# Patient Record
Sex: Female | Born: 1935 | Race: White | Hispanic: No | Smoking: Former smoker
Health system: Southern US, Community
[De-identification: ages and names within clinical notes are randomized; demographics above are authoritative.]

## PROBLEM LIST (undated history)

## (undated) DIAGNOSIS — K759 Inflammatory liver disease, unspecified: Secondary | ICD-10-CM

## (undated) DIAGNOSIS — M1990 Unspecified osteoarthritis, unspecified site: Secondary | ICD-10-CM

---

## 2005-03-07 ENCOUNTER — Ambulatory Visit: Payer: Self-pay | Admitting: Pain Medicine

## 2005-03-20 ENCOUNTER — Ambulatory Visit: Payer: Self-pay | Admitting: Pain Medicine

## 2005-07-19 ENCOUNTER — Ambulatory Visit: Payer: Self-pay | Admitting: Internal Medicine

## 2008-01-16 IMAGING — US ABDOMEN ULTRASOUND
1 series · 17 of 25 positions shown · non-contrast
Comparison: none

REASON FOR EXAM: Abdominal pain and nausea
COMMENTS:

[Series 1: abdomen ultrasound · 17 of 55 slices shown]
[im 1/55]
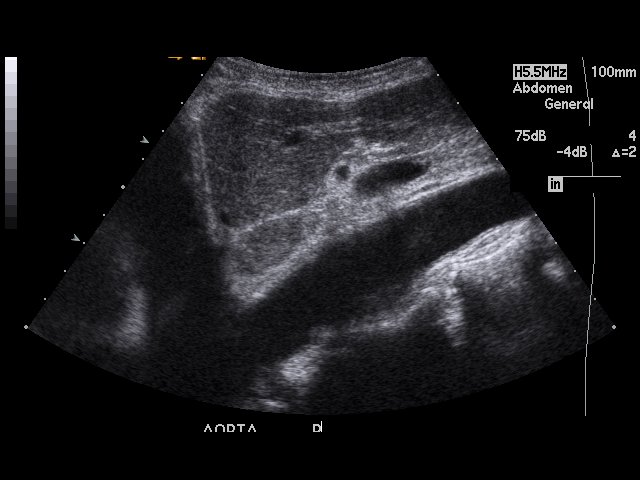
[im 5/55]
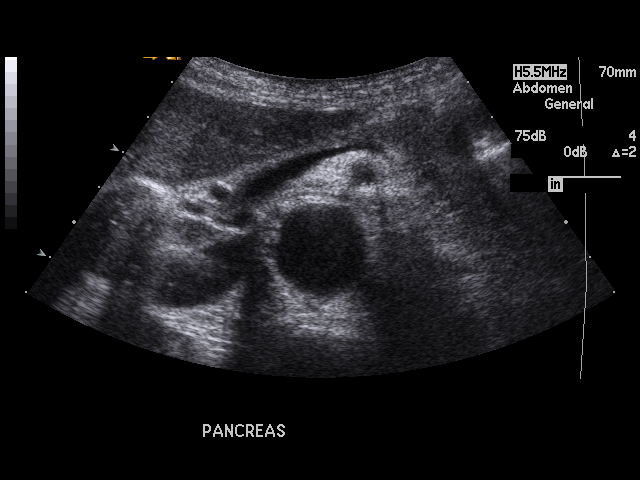
[im 7/55]
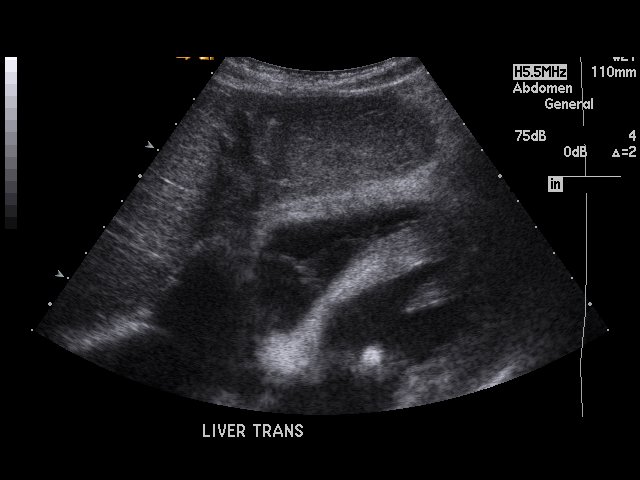
[im 12/55]
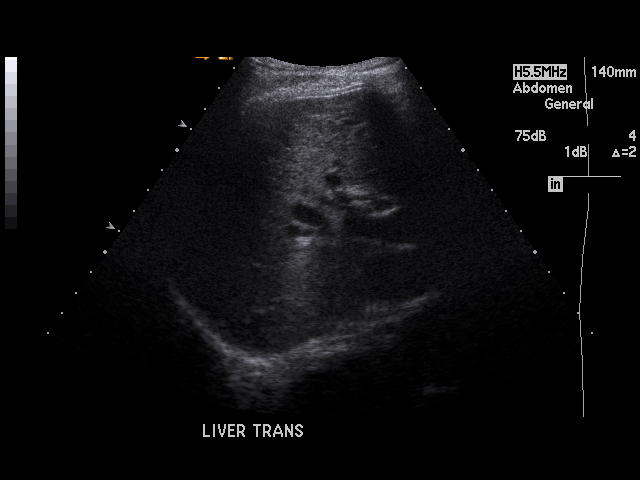
[im 14/55]
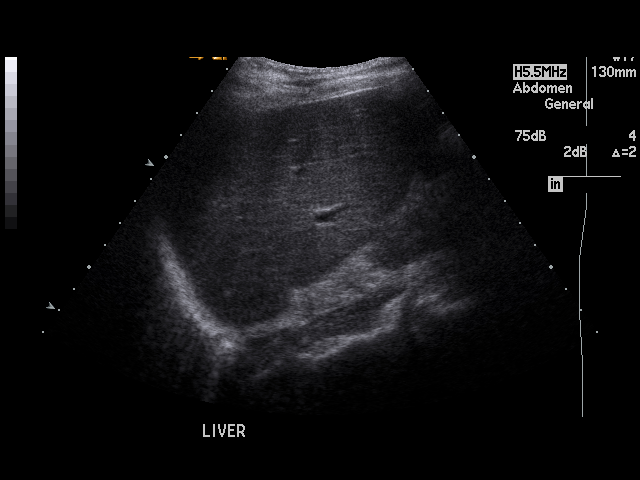
[im 19/55]
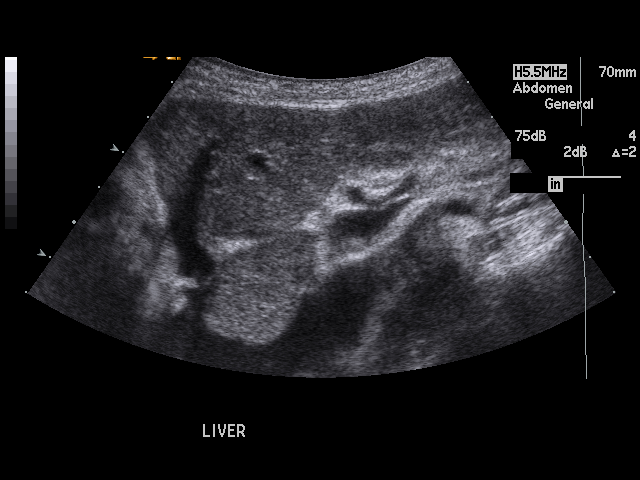
[im 21/55]
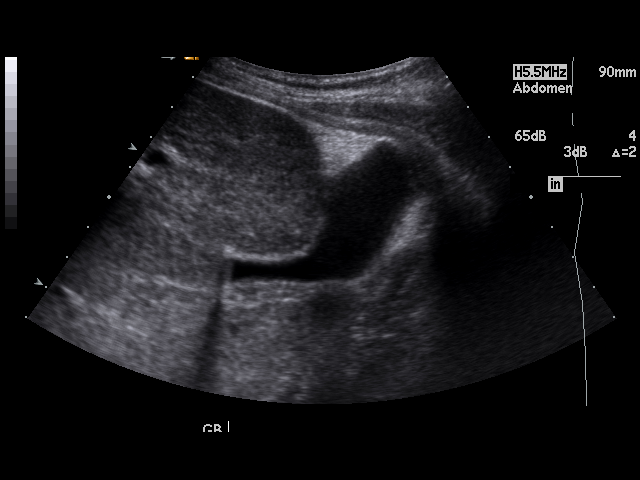
[im 25/55]
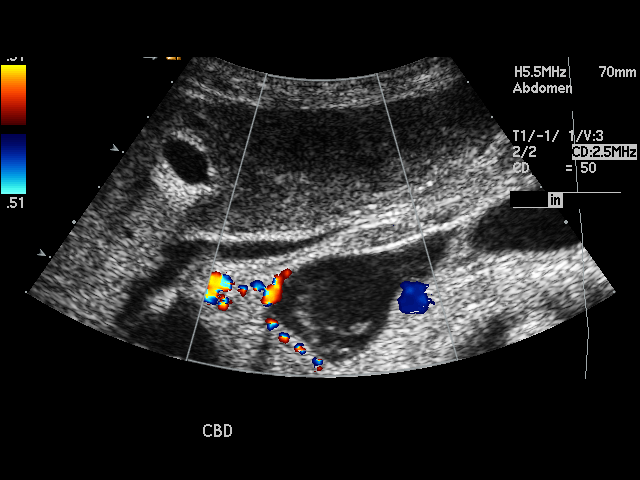
[im 28/55]
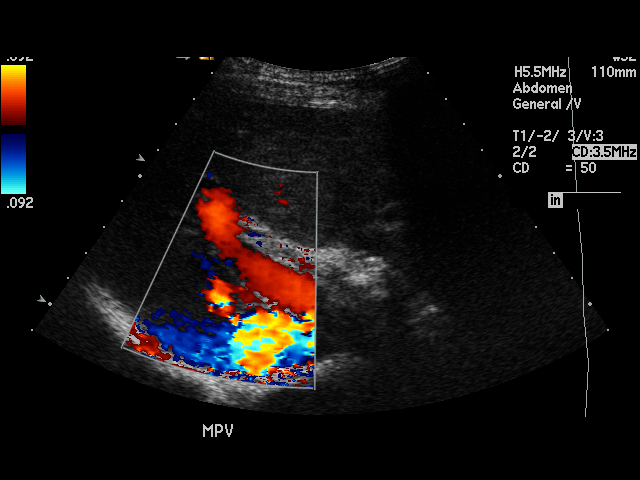
[im 30/55]
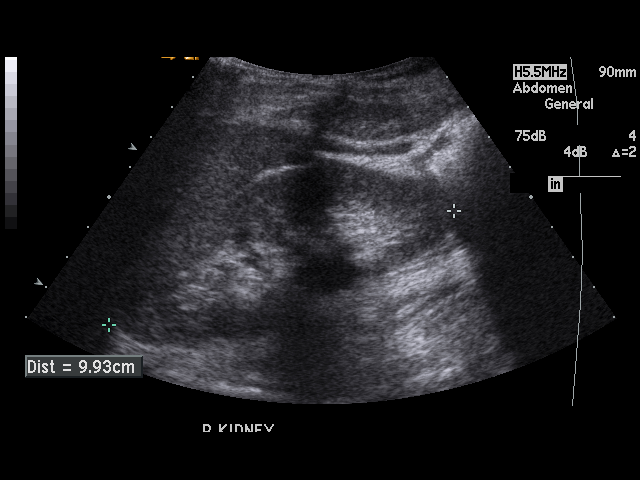
[im 34/55]
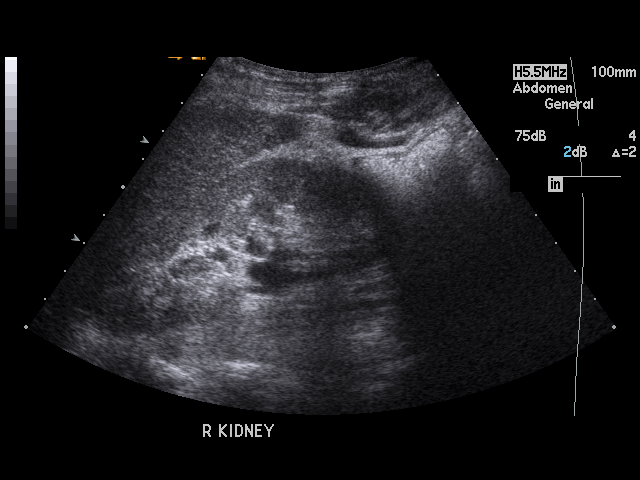
[im 37/55]
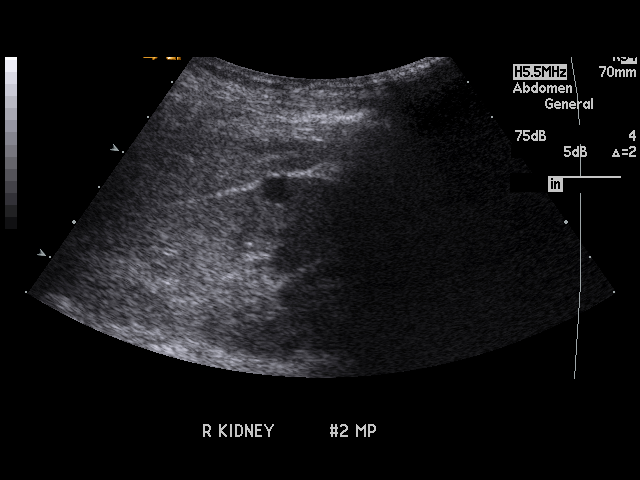
[im 41/55]
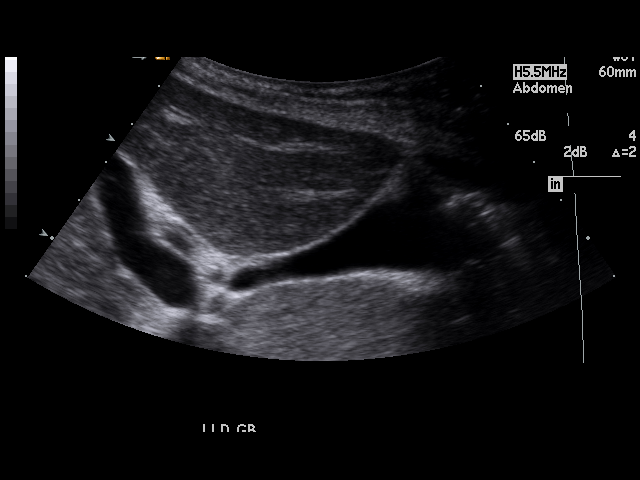
[im 43/55]
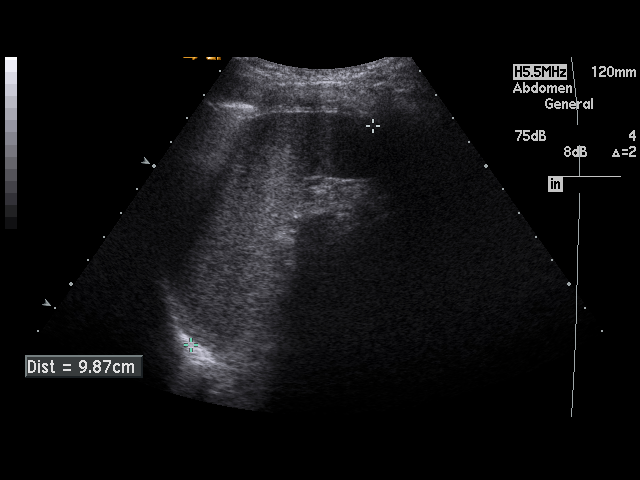
[im 48/55]
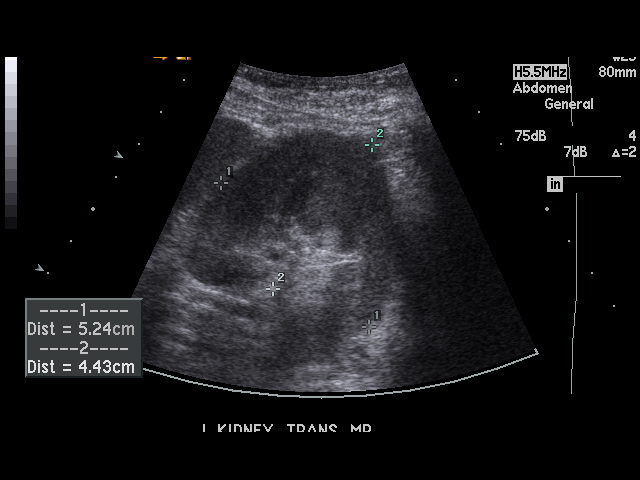
[im 50/55]
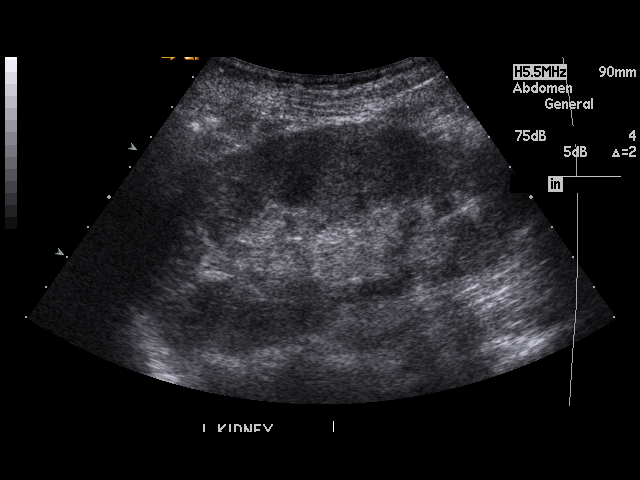
[im 55/55]
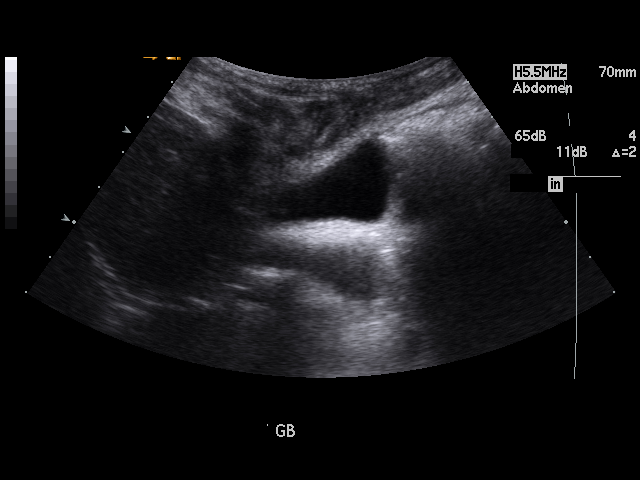

[17 of 25 positions shown; findings below may reference images not displayed]

PROCEDURE:     US  - US ABDOMEN GENERAL SURVEY  - July 19, 2005 [DATE]

RESULT:        The liver, spleen and pancreas are normal in appearance.  No
gallstones are seen.  There is no thickening of the gallbladder wall.   The
common bile duct measures 5.4 mm in diameter which is within normal limits.
The kidneys show no hydronephrosis.  There are noted two small cysts of the
RIGHT kidney with the larger being at the upper pole and measuring 1.11 cm
in diameter.  There is also noted a 9 mm cyst of the upper pole of the LEFT
kidney.   There is no ascites.
IMPRESSION: 1.     No significant abnormalities are identified.
2.     No gallstones are seen.
3.     Small bilateral renal cysts are observed.

## 2013-09-23 ENCOUNTER — Ambulatory Visit: Payer: Self-pay | Admitting: Internal Medicine

## 2013-11-04 ENCOUNTER — Encounter: Payer: Self-pay | Admitting: Surgery

## 2013-11-16 ENCOUNTER — Encounter: Payer: Self-pay | Admitting: Surgery

## 2013-12-16 ENCOUNTER — Encounter: Payer: Self-pay | Admitting: Surgery

## 2014-01-16 ENCOUNTER — Encounter: Payer: Self-pay | Admitting: Surgery

## 2014-01-20 ENCOUNTER — Encounter: Payer: Self-pay | Admitting: General Surgery

## 2014-02-15 ENCOUNTER — Encounter: Payer: Self-pay | Admitting: Surgery

## 2014-02-15 ENCOUNTER — Encounter: Payer: Self-pay | Admitting: General Surgery

## 2014-10-21 ENCOUNTER — Observation Stay
Admission: EM | Admit: 2014-10-21 | Discharge: 2014-10-24 | Disposition: A | Discharge status: Skilled Nursing Facility | Payer: Medicare Other | Attending: Internal Medicine | Admitting: Internal Medicine

## 2014-10-21 ENCOUNTER — Encounter: Payer: Self-pay | Admitting: Emergency Medicine

## 2014-10-21 DIAGNOSIS — E871 Hypo-osmolality and hyponatremia: Secondary | ICD-10-CM | POA: Diagnosis not present

## 2014-10-21 DIAGNOSIS — Z87891 Personal history of nicotine dependence: Secondary | ICD-10-CM | POA: Diagnosis not present

## 2014-10-21 DIAGNOSIS — M25521 Pain in right elbow: Secondary | ICD-10-CM

## 2014-10-21 DIAGNOSIS — Z823 Family history of stroke: Secondary | ICD-10-CM | POA: Insufficient documentation

## 2014-10-21 DIAGNOSIS — M1990 Unspecified osteoarthritis, unspecified site: Secondary | ICD-10-CM | POA: Insufficient documentation

## 2014-10-21 DIAGNOSIS — M25522 Pain in left elbow: Secondary | ICD-10-CM | POA: Diagnosis not present

## 2014-10-21 DIAGNOSIS — R531 Weakness: Secondary | ICD-10-CM | POA: Diagnosis present

## 2014-10-21 DIAGNOSIS — M25421 Effusion, right elbow: Secondary | ICD-10-CM

## 2014-10-21 HISTORY — DX: Inflammatory liver disease, unspecified: K75.9

## 2014-10-21 HISTORY — DX: Unspecified osteoarthritis, unspecified site: M19.90

## 2014-10-21 LAB — COMPREHENSIVE METABOLIC PANEL
ALT: 16 U/L (ref 14–54)
ANION GAP: 8 (ref 5–15)
AST: 35 U/L (ref 15–41)
Albumin: 3.8 g/dL (ref 3.5–5.0)
Alkaline Phosphatase: 71 U/L (ref 38–126)
BILIRUBIN TOTAL: 0.9 mg/dL (ref 0.3–1.2)
BUN: 17 mg/dL (ref 6–20)
CHLORIDE: 94 mmol/L — AB (ref 101–111)
CO2: 25 mmol/L (ref 22–32)
Calcium: 8.2 mg/dL — ABNORMAL LOW (ref 8.9–10.3)
Creatinine, Ser: 0.37 mg/dL — ABNORMAL LOW (ref 0.44–1.00)
GFR calc Af Amer: >60 mL/min (ref >60)
GFR calc non Af Amer: >60 mL/min (ref >60)
GLUCOSE: 97 mg/dL (ref 65–99)
POTASSIUM: 3.6 mmol/L (ref 3.5–5.1)
SODIUM: 127 mmol/L — AB (ref 135–145)
TOTAL PROTEIN: 6.6 g/dL (ref 6.5–8.1)

## 2014-10-21 LAB — URINALYSIS COMPLETE WITH MICROSCOPIC (ARMC ONLY)
BILIRUBIN URINE: NEGATIVE
Bacteria, UA: NONE SEEN
GLUCOSE, UA: NEGATIVE mg/dL
Nitrite: NEGATIVE
Protein, ur: 30 mg/dL — AB
Specific Gravity, Urine: 1.021 (ref 1.005–1.030)
pH: 5 (ref 5.0–8.0)

## 2014-10-21 LAB — CBC WITH DIFFERENTIAL/PLATELET
BASOS ABS: 0 10*3/uL (ref 0–0.1)
BASOS PCT: 1 %
EOS ABS: 0 10*3/uL (ref 0–0.7)
EOS PCT: 0 %
HCT: 40.3 % (ref 35.0–47.0)
Hemoglobin: 13.8 g/dL (ref 12.0–16.0)
LYMPHS PCT: 17 %
Lymphs Abs: 1.3 10*3/uL (ref 1.0–3.6)
MCH: 33.8 pg (ref 26.0–34.0)
MCHC: 34.4 g/dL (ref 32.0–36.0)
MCV: 98.2 fL (ref 80.0–100.0)
MONO ABS: 0.6 10*3/uL (ref 0.2–0.9)
Monocytes Relative: 8 %
NEUTROS PCT: 74 %
Neutro Abs: 5.6 10*3/uL (ref 1.4–6.5)
PLATELETS: 141 10*3/uL — AB (ref 150–440)
RBC: 4.1 MIL/uL (ref 3.80–5.20)
RDW: 13.1 % (ref 11.5–14.5)
WBC: 7.5 10*3/uL (ref 3.6–11.0)

## 2014-10-21 LAB — TROPONIN I: Troponin I: <0.03 ng/mL (ref <0.031)

## 2014-10-21 MED ORDER — ONDANSETRON HCL 4 MG PO TABS: 4.0000 mg | ORAL_TABLET | Freq: Four times a day (QID) | ORAL | Status: DC | PRN

## 2014-10-21 MED ORDER — SODIUM CHLORIDE 0.9 % IV SOLN
Freq: Once | INTRAVENOUS | Status: AC
  Administered 2014-10-21: 15:00:00 via INTRAVENOUS

## 2014-10-21 MED ORDER — ONDANSETRON HCL 4 MG/2ML IJ SOLN: 4.0000 mg | Freq: Four times a day (QID) | INTRAMUSCULAR | Status: DC | PRN

## 2014-10-21 MED ORDER — IBUPROFEN 200 MG PO TABS
200.0000 mg | ORAL_TABLET | Freq: Four times a day (QID) | ORAL | Status: DC | PRN
  Administered 2014-10-22: 18:00:00 200 mg via ORAL
  Filled 2014-10-21: qty 1

## 2014-10-21 MED ORDER — ACETAMINOPHEN 325 MG PO TABS: 650.0000 mg | ORAL_TABLET | Freq: Four times a day (QID) | ORAL | Status: DC | PRN

## 2014-10-21 MED ORDER — SODIUM CHLORIDE 0.9 % IV SOLN
INTRAVENOUS | Status: DC
  Administered 2014-10-21 – 2014-10-23 (×6): via INTRAVENOUS

## 2014-10-21 MED ORDER — ACETAMINOPHEN 650 MG RE SUPP: 650.0000 mg | Freq: Four times a day (QID) | RECTAL | Status: DC | PRN

## 2014-10-21 MED ORDER — ENOXAPARIN SODIUM 40 MG/0.4ML ~~LOC~~ SOLN
40.0000 mg | SUBCUTANEOUS | Status: DC
Start: 2014-10-21 — End: 2014-10-22

## 2014-10-21 MED ORDER — TRAMADOL HCL 50 MG PO TABS: 25.0000 mg | ORAL_TABLET | Freq: Two times a day (BID) | ORAL | Status: DC | PRN

## 2014-10-21 NOTE — ED Notes (Signed)
Pt comes in via EMS c/o generalized weakness after taking a half of a tramadol yesterday for elbow pain.  Patient could not get from her wheel chair to her bed last night so she stayed in her wheel chair.  Son attempted to get her out of her wheel chair today and bumped her left elbow causing a skin tear.

## 2014-10-21 NOTE — ED Provider Notes (Signed)
[facility] Emergency Department Provider Note     Time seen: ----------------------------------------- 12:20 PM on 10/21/2014 -----------------------------------------    I have reviewed the triage vital signs and the nursing notes.   HISTORY  Chief Complaint Weakness    HPI Laura Sawyer is a 81 y.o. female brought to ER for generalized weakness. Patient states she did have a tramadol yesterday for elbow pain and could not get from her wheelchair to her bed last night so she stayed in her wheelchair. Side to get her out of wheelchair today and bumped her elbow causing a skin tear. She's been using a walker for years, denies any recent illness. Recently was given tramadol for elbow pain, denies any other complaints or pain. Denies any falls.   Past Medical History  Diagnosis Date  . Arthritis   . Hepatitis     There are no active problems to display for this patient.   History reviewed. No pertinent past surgical history.  Allergies Review of patient's allergies indicates no known allergies.  Social History History  Substance Use Topics  . Smoking status: Former Games developer  . Smokeless tobacco: Not on file  . Alcohol Use: No    Review of Systems Constitutional: Negative for fever. Eyes: Negative for visual changes. ENT: Negative for sore throat. Cardiovascular: Negative for chest pain. Respiratory: Negative for shortness of breath. Gastrointestinal: Negative for abdominal pain, vomiting and diarrhea. Genitourinary: Negative for dysuria. Musculoskeletal: Negative for back pain. Skin: Negative for rash. Neurological: Negative for headaches, positive for generalized weakness  10-point ROS otherwise negative.  ____________________________________________   PHYSICAL EXAM:  VITAL SIGNS: ED Triage Vitals  Enc Vitals Group     BP 10/21/14 1206 136/88 mmHg     Pulse Rate 10/21/14 1206 98     Resp --      Temp 10/21/14 1206 98.1 F (36.7  C)     Temp Source 10/21/14 1206 Oral     SpO2 10/21/14 1206 97 %     Weight 10/21/14 1206 125 lb (56.7 kg)     Height 10/21/14 1206  (1.499 m)     Head Cir --      Peak Flow --      Pain Score --      Pain Loc --      Pain Edu? --      Excl. in GC? --     Constitutional: Alert and oriented. Well appearing and in no distress. Eyes: Conjunctivae are normal. PERRL. Normal extraocular movements. ENT   Head: Normocephalic and atraumatic.   Nose: No congestion/rhinnorhea.   Mouth/Throat: Mucous membranes are moist.   Neck: No stridor. Cardiovascular: Normal rate, regular rhythm. Normal and symmetric distal pulses are present in all extremities. No murmurs, rubs, or gallops. Respiratory: Normal respiratory effort without tachypnea nor retractions. Breath sounds are clear and equal bilaterally. No wheezes/rales/rhonchi. Gastrointestinal: Soft and nontender. No distention. No abdominal bruits.  Musculoskeletal: Nontender with normal range of motion in all extremities. No joint effusions.  No lower extremity tenderness nor edema. Neurologic:  Normal speech and language. No gross focal neurologic deficits are appreciated. Speech is normal. No gait instability. Skin:  Skin tears noted over the left elbow Psychiatric: Mood and affect are normal. Speech and behavior are normal. Patient exhibits appropriate insight and judgment. ____________________________________________  EKG: Interpreted by me. Normal sinus rhythm with rate 85 bpm, PVCs, normal axis. No evidence of acute infarction.  ____________________________________________  ED COURSE:  Pertinent labs & imaging results  that were available during my care of the patient were reviewed by me and considered in my medical decision making (see chart for details).  ____________________________________________    LABS (pertinent positives/negatives)  Labs Reviewed  CBC WITH DIFFERENTIAL/PLATELET - Abnormal; Notable for  the following:    Platelets 141 (*)    All other components within normal limits  COMPREHENSIVE METABOLIC PANEL - Abnormal; Notable for the following:    Sodium 127 (*)    Chloride 94 (*)    Creatinine, Ser 0.37 (*)    Calcium 8.2 (*)    All other components within normal limits  URINALYSIS COMPLETEWITH MICROSCOPIC ([facility] ONLY) - Abnormal; Notable for the following:    Color, Urine YELLOW (*)    APPearance CLEAR (*)    Ketones, ur 1+ (*)    Hgb urine dipstick 1+ (*)    Protein, ur 30 (*)    Leukocytes, UA TRACE (*)    Squamous Epithelial / LPF 0-5 (*)    All other components within normal limits  TROPONIN I  CBG MONITORING, ED   ____________________________________________  FINAL ASSESSMENT AND PLAN  Weakness, hyponatremia  Plan: Patient with labs and imaging as dictated above. Patient is unable to walk, has hyponatremia. Likely chronic however will start on saline. We'll bring in for observation and have PT evaluate.   Emily Filbert, MD   Emily Filbert, MD 10/21/14 [phone]

## 2014-10-21 NOTE — Progress Notes (Signed)
Patient and patient's son Tommie Raymond verbalized that they did not want any heroic measures taken to resuscitate patient.  Dr. Cherlynn Kaiser called and notified.  Telephone order received for DNR status.

## 2014-10-21 NOTE — ED Notes (Signed)
MD at bedside. 

## 2014-10-21 NOTE — H&P (Signed)
[facility] [place] at [facility]   PATIENT NAME: Laura Sawyer    MR#:  [id number]  DATE OF BIRTH:  [birth year]  DATE OF ADMISSION:  10/21/2014  PRIMARY CARE PHYSICIAN: Marguarite Arbour, MD   REQUESTING/REFERRING PHYSICIAN: Dr. Daryel November  CHIEF COMPLAINT:   Chief Complaint  Patient presents with  . Weakness    HISTORY OF PRESENT ILLNESS:  Laura Sawyer  is a 81 y.o. female with a known history of osteoarthritis who presents to the hospital due to weakness and difficulty ambulating over the past 2 days. Patient normally is able to handle it with the help of walker but since yesterday patient has been unable to get out of bed and even ambulate with a walker. This is not usual for the patient and therefore the family was concerned and brought her to the ER for further evaluation incidentally patient was noted to be hyponatremic with a sodium of 127. Clinically patient denies any pain, nausea, vomiting, diarrhea or any other associated symptoms. As per the family patient has not had any recent falls at home.  PAST MEDICAL HISTORY:   Past Medical History  Diagnosis Date  . Arthritis   . Hepatitis     PAST SURGICAL HISTORY:  History reviewed. No pertinent past surgical history.  SOCIAL HISTORY:   History  Substance Use Topics  . Smoking status: Former Games developer  . Smokeless tobacco: Not on file  . Alcohol Use: No    FAMILY HISTORY:   Family History  Problem Relation Age of Onset  . CVA Father     DRUG ALLERGIES:  No Known Allergies  REVIEW OF SYSTEMS:   Review of Systems  Constitutional: Negative for fever and weight loss.  HENT: Negative for congestion, nosebleeds and tinnitus.   Eyes: Negative for blurred vision, double vision and redness.  Respiratory: Negative for cough, hemoptysis and shortness of breath.   Cardiovascular: Negative for chest pain, orthopnea, leg swelling and PND.  Gastrointestinal: Negative for nausea, vomiting,  abdominal pain, diarrhea and melena.  Genitourinary: Negative for dysuria, urgency and hematuria.  Musculoskeletal: Negative for joint pain and falls.  Neurological: Positive for weakness (genearlized). Negative for dizziness, tingling, sensory change, focal weakness, seizures and headaches.  Endo/Heme/Allergies: Negative for polydipsia. Does not bruise/bleed easily.  Psychiatric/Behavioral: Negative for depression and memory loss. The patient is not nervous/anxious.     MEDICATIONS AT HOME:   Prior to Admission medications   Medication Sig Start Date End Date Taking? Authorizing Provider  ibuprofen (ADVIL,MOTRIN) 200 MG tablet Take 200 mg by mouth every 6 (six) hours as needed.   Yes Historical Provider, MD  traMADol (ULTRAM) 50 MG tablet Take 25 mg by mouth 2 (two) times daily as needed.   Yes Historical Provider, MD      VITAL SIGNS:  Blood pressure 110/74, pulse 84, temperature 98.1 F (36.7 C), temperature source Oral, resp. rate 14, height  (1.499 m), weight 56.7 kg (125 lb), SpO2 96 %.  PHYSICAL EXAMINATION:  Physical Exam  GENERAL:  81 y.o.-year-old patient lying in the bed with no acute distress.  EYES: Pupils equal, round, reactive to light and accommodation. No scleral icterus. Extraocular muscles intact.  HEENT: Head atraumatic, normocephalic. Oropharynx and nasopharynx clear. No oropharyngeal erythema, moist oral mucosa  NECK:  Supple, no jugular venous distention. No thyroid enlargement, no tenderness.  LUNGS: Normal breath sounds bilaterally, no wheezing, rales, rhonchi. No use of accessory muscles of respiration.  CARDIOVASCULAR: S1, S2, RRR. 2/6 systolic  ejection murmur at the right sternal border, No rubs, gallops, clicks.  ABDOMEN: Soft, nontender, nondistended. Bowel sounds present. No organomegaly or mass.  EXTREMITIES: No pedal edema, cyanosis, or clubbing. + 2 pedal & radial pulses b/l.   NEUROLOGIC: Cranial nerves II through XII are intact. No focal Motor  or sensory deficits appreciated b/l.  Globally weak PSYCHIATRIC: The patient is alert and oriented x 3. Good affect.  SKIN: No obvious rash, lesion, or ulcer.   LABORATORY PANEL:   CBC  Recent Labs Lab 10/21/14 1325  WBC 7.5  HGB 13.8  HCT 40.3  PLT 141*   ------------------------------------------------------------------------------------------------------------------  Chemistries   Recent Labs Lab 10/21/14 1325  NA 127*  K 3.6  CL 94*  CO2 25  GLUCOSE 97  BUN 17  CREATININE 0.37*  CALCIUM 8.2*  AST 35  ALT 16  ALKPHOS 71  BILITOT 0.9   ------------------------------------------------------------------------------------------------------------------  Cardiac Enzymes  Recent Labs Lab 10/21/14 1325  TROPONINI <0.03   ------------------------------------------------------------------------------------------------------------------  RADIOLOGY:  No results found.   IMPRESSION AND PLAN:   81 year old female with past medical history of osteoarthritis who presents to the hospital due to difficulty walking and generalized weakness.  #1 generalized weakness/difficulty walking-etiology unclear but suspected to be multifactorial and related to her deconditioning and may be underlying hyponatremia. -Hydrate her with IV fluids to correct her hyponatremia. We'll get a physical therapy consult.  #2 hyponatremia-likely hypovolemic hypotonic hyponatremia. -We will hydrate with IV fluids and follow sodium. If not correcting check urine and serum osmolality.  #3 osteoarthritis-continue Motrin, tramadol when necessary for pain.   All the records are reviewed and case discussed with ED provider. Management plans discussed with the patient, family and they are in agreement.  CODE STATUS: Full  TOTAL TIME TAKING CARE OF THIS PATIENT: 45 minutes.    Houston Siren M.D on 10/21/2014 at 3:29 PM  Between 7am to 6pm - Pager - [phone]  After 6pm go to [email or web address]  - password [email or web address]  [facility]  Office  [phone]  CC: Primary care physician; Marguarite Arbour, MD

## 2014-10-21 NOTE — Progress Notes (Signed)
Patient accepted into [facility].  VSS.  Patient denies pain.  Oriented patient to room.  Bed alarm applied.

## 2014-10-22 ENCOUNTER — Observation Stay: Payer: Medicare Other

## 2014-10-22 LAB — BASIC METABOLIC PANEL
Anion gap: 5 (ref 5–15)
BUN: 20 mg/dL (ref 6–20)
CO2: 26 mmol/L (ref 22–32)
Calcium: 7.6 mg/dL — ABNORMAL LOW (ref 8.9–10.3)
Chloride: 101 mmol/L (ref 101–111)
Creatinine, Ser: 0.43 mg/dL — ABNORMAL LOW (ref 0.44–1.00)
GFR calc Af Amer: >60 mL/min (ref >60)
Glucose, Bld: 83 mg/dL (ref 65–99)
Potassium: 3.6 mmol/L (ref 3.5–5.1)
Sodium: 132 mmol/L — ABNORMAL LOW (ref 135–145)

## 2014-10-22 NOTE — Progress Notes (Signed)
Dr. Marcello Fennel telephoned order to stop lovenox due to patient bleeding on left elbow

## 2014-10-22 NOTE — Care Management Note (Signed)
Case Management Note  Patient Details  Name: Laura Sawyer MRN: [id number] Date of Birth: [birth year]  Subjective/Objective:       PT is recommending SNF. A referral to Social Work has been made. CM will follow for discharge planning if [name] does not go to a SNF.              Action/Plan:   Expected Discharge Date:                  Expected Discharge Plan:     In-House Referral:     Discharge planning Services     Post Acute Care Choice:    Choice offered to:     DME Arranged:    DME Agency:     HH Arranged:    HH Agency:     Status of Service:     Medicare Important Message Given:    Date Medicare IM Given:    Medicare IM give by:    Date Additional Medicare IM Given:    Additional Medicare Important Message give by:     If discussed at Long Length of Stay Meetings, dates discussed:    Additional Comments:  [name] A, RN 10/22/2014, 5:29 PM

## 2014-10-22 NOTE — Evaluation (Signed)
Physical Therapy Evaluation Patient Details Name: Laura Sawyer MRN: [id number] DOB: Jun 23, 1918 Today's Date: 10/22/2014   History of Present Illness  Pt with recent weakness inability to transfer, also having sever L elbow pain  Clinical Impression  Pt is able to do some bed mobility and transfers with min/mod assist, but is not able to take even one full step at EOB. She complains of severe L elbow pain with almost all acts and her surges of pain do not seem to coinside with specific positions, etc.  She shows good effort and is interested in participating but is quite limited compared to her baseline and is not at all safe to go home at this time despite much family support.     Follow Up Recommendations SNF    Equipment Recommendations       Recommendations for Other Services       Precautions / Restrictions Precautions Precautions: Fall Restrictions Weight Bearing Restrictions: No      Mobility  Bed Mobility Overal bed mobility: Needs Assistance Bed Mobility: Supine to Sit;Sit to Supine     Supine to sit: Min assist;Mod assist Sit to supine: Mod assist      Transfers Overall transfer level: Needs assistance Equipment used: Rolling walker (2 wheeled) Transfers: Sit to/from Stand Sit to Stand: Mod assist         General transfer comment: Pt needs assist with cuing and set up as well as physical assit just to get to standing  Ambulation/Gait             General Gait Details: unable to do more than minimally slide single foot forward.  She is not confident or safe enough to attempt anything away from the EOB at this time.   Stairs            Wheelchair Mobility    Modified Rankin (Stroke Patients Only)       Balance                                             Pertinent Vitals/Pain Pain Assessment: 0-10 Pain Score: 10-Worst pain ever (seemingly unpredictable L elbow pain spikes)    Home Living Family/patient expects to  be discharged to:: Skilled nursing facility Living Arrangements: Children                    Prior Function Level of Independence: Independent with assistive device(s)         Comments: Pt able to do in-home acts, rarely out of the house     Hand Dominance        Extremity/Trunk Assessment   Upper Extremity Assessment:  (very limited UE AROM and strength)           Lower Extremity Assessment:  (L LE stronger than R, grossly limited t/o)         Communication      Cognition Arousal/Alertness: Awake/alert Behavior During Therapy: WFL for tasks assessed/performed Overall Cognitive Status: Within Functional Limits for tasks assessed                      General Comments      Exercises        Assessment/Plan    PT Assessment Patient needs continued PT services  PT Diagnosis Difficulty walking;Generalized weakness;Acute pain   PT Problem List Decreased strength;Pain;Decreased range  of motion;Decreased activity tolerance;Decreased balance;Decreased safety awareness;Decreased mobility  PT Treatment Interventions Gait training;Therapeutic activities;Therapeutic exercise;Balance training;Neuromuscular re-education   PT Goals (Current goals can be found in the Care Plan section) Acute Rehab PT Goals PT Goal Formulation: With patient/family Time For Goal Achievement: 11/05/14 Potential to Achieve Goals: Good    Frequency Min 2X/week   Barriers to discharge        Co-evaluation               End of Session Equipment Utilized During Treatment: Gait belt Activity Tolerance: Patient limited by pain Patient left: with bed alarm set Nurse Communication: Mobility status    Functional Assessment Tool Used: clinical judgement Functional Limitation: Mobility: Walking and moving around Mobility: Walking and Moving Around Current Status (Z6109): At least 60 percent but less than 80 percent impaired, limited or restricted Mobility: Walking and  Moving Around Goal Status [phone]): At least 20 percent but less than 40 percent impaired, limited or restricted    Time: 0981-1914 PT Time Calculation (min) (ACUTE ONLY): 36 min   Charges:   PT Evaluation $Initial PT Evaluation Tier I: 1 Procedure     PT G Codes:   PT G-Codes **NOT FOR INPATIENT CLASS** Functional Assessment Tool Used: clinical judgement Functional Limitation: Mobility: Walking and moving around Mobility: Walking and Moving Around Current Status (N8295): At least 60 percent but less than 80 percent impaired, limited or restricted Mobility: Walking and Moving Around Goal Status [phone]): At least 20 percent but less than 40 percent impaired, limited or restricted   Laura Sawyer, PT, DPT [phone]  Laura Sawyer 10/22/2014, 4:53 PM

## 2014-10-22 NOTE — Progress Notes (Signed)
   PROGRESS NOTE  Laura Sawyer UJW:[id number] DOB: 1918-07-08 DOA: 10/21/2014 PCP: Marguarite Arbour, MD  Subjective:  81 y/o f with hx of Degenerative OA, admitted with generalized weakness, difficulty in ambulation and Hyponatremia. This am feels better. Se Sodium improved to 132   Objective: BP 115/63 mmHg  Pulse 72  Temp(Src) 98.5 F (36.9 C) (Oral)  Resp 18  Ht  (1.372 m)  Wt 49.244 kg (108 lb 9 oz)  BMI 26.16 kg/m2  SpO2 95%  Intake/Output Summary (Last 24 hours) at 10/22/14 1025 Last data filed at 10/22/14 0800  Gross per 24 hour  Intake 1275.83 ml  Output    400 ml  Net 875.83 ml   Filed Weights   10/21/14 1206 10/21/14 1801  Weight: 56.7 kg (125 lb) 49.244 kg (108 lb 9 oz)    Exam: HEENT: [place], AT  General:  Not in distress  Cardiovascular: S1 S2 2/6 SEM +  Respiratory: Clear to auscultation  Abdomen: Soft, Non tender  Neuro:Non Focal weakness  Data Reviewed: Basic Metabolic Panel:  Recent Labs Lab 10/21/14 1325 10/22/14 0414  NA 127* 132*  K 3.6 3.6  CL 94* 101  CO2 25 26  GLUCOSE 97 83  BUN 17 20  CREATININE 0.37* 0.43*  CALCIUM 8.2* 7.6*   Liver Function Tests:  Recent Labs Lab 10/21/14 1325  AST 35  ALT 16  ALKPHOS 71  BILITOT 0.9  PROT 6.6  ALBUMIN 3.8   No results for input(s): LIPASE, AMYLASE in the last 168 hours. No results for input(s): AMMONIA in the last 168 hours. CBC:  Recent Labs Lab 10/21/14 1325  WBC 7.5  NEUTROABS 5.6  HGB 13.8  HCT 40.3  MCV 98.2  PLT 141*   Cardiac Enzymes:    Recent Labs Lab 10/21/14 1325  TROPONINI <0.03   BNP (last 3 results) No results for input(s): BNP in the last 8760 hours.  ProBNP (last 3 results) No results for input(s): PROBNP in the last 8760 hours.  CBG: No results for input(s): GLUCAP in the last 168 hours.  No results found for this or any previous visit (from the past 240 hour(s)).   Studies: No results found.  Scheduled Meds: . enoxaparin  (LOVENOX) injection  40 mg Subcutaneous Q24H    Continuous Infusions: . sodium chloride 100 mL/hr at 10/22/14 5621    Assessment/Plan: 1 Generalized weakness with difficulty in ambulation; Rec Physical Therapy 2 Hyponatremia: Improving with Hydration- Continue to monitor    Code Status: Full        [name]   10/22/2014, 10:25 AM

## 2014-10-22 NOTE — Plan of Care (Signed)
Problem: Discharge Progression Outcomes Goal: Other Discharge Outcomes/Goals Outcome: Progressing Pt admitted yesterday for generalized weakness and hyponatremia. IVF inf suing as ordered, pt with pt consult otherwise not out of the bed, no complaints of pain, VSS.

## 2014-10-22 NOTE — Progress Notes (Signed)
Dr. Marcello Fennel notified of patient complaining of left elbow pain upon movement. Patient bleeding through bandage on left elbow. Dr. Marcello Fennel ordered xray to left elbow.

## 2014-10-23 LAB — CBC WITH DIFFERENTIAL/PLATELET
Basophils Absolute: 0 10*3/uL (ref 0–0.1)
Basophils Relative: 1 %
Eosinophils Absolute: 0.2 10*3/uL (ref 0–0.7)
Eosinophils Relative: 3 %
HCT: 35.1 % (ref 35.0–47.0)
HEMOGLOBIN: 12.3 g/dL (ref 12.0–16.0)
LYMPHS ABS: 1.4 10*3/uL (ref 1.0–3.6)
Lymphocytes Relative: 26 %
MCH: 34.8 pg — AB (ref 26.0–34.0)
MCHC: 34.9 g/dL (ref 32.0–36.0)
MCV: 99.5 fL (ref 80.0–100.0)
MONOS PCT: 9 %
Monocytes Absolute: 0.5 10*3/uL (ref 0.2–0.9)
NEUTROS PCT: 61 %
Neutro Abs: 3.3 10*3/uL (ref 1.4–6.5)
Platelets: 112 10*3/uL — ABNORMAL LOW (ref 150–440)
RBC: 3.53 MIL/uL — ABNORMAL LOW (ref 3.80–5.20)
RDW: 13.3 % (ref 11.5–14.5)
WBC: 5.4 10*3/uL (ref 3.6–11.0)

## 2014-10-23 LAB — BASIC METABOLIC PANEL
ANION GAP: 6 (ref 5–15)
BUN: 25 mg/dL — ABNORMAL HIGH (ref 6–20)
CALCIUM: 7.4 mg/dL — AB (ref 8.9–10.3)
CO2: 24 mmol/L (ref 22–32)
Chloride: 103 mmol/L (ref 101–111)
Creatinine, Ser: 0.44 mg/dL (ref 0.44–1.00)
GFR calc Af Amer: >60 mL/min (ref >60)
Glucose, Bld: 91 mg/dL (ref 65–99)
POTASSIUM: 3.8 mmol/L (ref 3.5–5.1)
SODIUM: 133 mmol/L — AB (ref 135–145)

## 2014-10-23 NOTE — Plan of Care (Signed)
Problem: Discharge Progression Outcomes Goal: Other Discharge Outcomes/Goals Outcome: Progressing VSS, labs improving  IVF infusing as ordered, PT saw patient and are recomending rehab at this time, bed search underway no complaints of pain.

## 2014-10-23 NOTE — Progress Notes (Signed)
PROGRESS NOTE  Laura Sawyer:[id number] DOB: 03-26-1918 DOA: 10/21/2014 PCP: Marguarite Arbour, MD  Subjective:  81 y/o f with hx of Degenerative OA, admitted with generalized weakness, difficulty in ambulation and Hyponatremia. C/o Left elbow pain and some bleeding- Xray no fracture-  Lovenox was d'cd This am still weak Se Sodium is 133   Objective: BP 130/69 mmHg  Pulse 65  Temp(Src) 98.2 F (36.8 C) (Oral)  Resp 18  Ht 4\' 6"  (1.372 m)  Wt 49.244 kg (108 lb 9 oz)  BMI 26.16 kg/m2  SpO2 97%  Intake/Output Summary (Last 24 hours) at 10/23/14 1117 Last data filed at 10/23/14 1027  Gross per 24 hour  Intake   3401 ml  Output   1500 ml  Net   1901 ml   Filed Weights   10/21/14 1206 10/21/14 1801  Weight: 56.7 kg (125 lb) 49.244 kg (108 lb 9 oz)    Exam: HEENT: [place], AT  General:  Not in distress  Cardiovascular: S1 S2 2/6 SEM +  Respiratory: Clear to auscultation  Abdomen: Soft, Non tender  Neuro:Non Focal weakness  Extremities; Left elbow tender and swollen  Data Reviewed: Basic Metabolic Panel:  Recent Labs Lab 10/21/14 1325 10/22/14 0414 10/23/14 0412  NA 127* 132* 133*  K 3.6 3.6 3.8  CL 94* 101 103  CO2 25 26 24   GLUCOSE 97 83 91  BUN 17 20 25*  CREATININE 0.37* 0.43* 0.44  CALCIUM 8.2* 7.6* 7.4*   Liver Function Tests:  Recent Labs Lab 10/21/14 1325  AST 35  ALT 16  ALKPHOS 71  BILITOT 0.9  PROT 6.6  ALBUMIN 3.8   No results for input(s): LIPASE, AMYLASE in the last 168 hours. No results for input(s): AMMONIA in the last 168 hours. CBC:  Recent Labs Lab 10/21/14 1325 10/23/14 0412  WBC 7.5 5.4  NEUTROABS 5.6 3.3  HGB 13.8 12.3  HCT 40.3 35.1  MCV 98.2 99.5  PLT 141* 112*   Cardiac Enzymes:    Recent Labs Lab 10/21/14 1325  TROPONINI <0.03   BNP (last 3 results) No results for input(s): BNP in the last 8760 hours.  ProBNP (last 3 results) No results for input(s): PROBNP in the last 8760 hours.  CBG: No  results for input(s): GLUCAP in the last 168 hours.  No results found for this or any previous visit (from the past 240 hour(s)).   Studies: Dg Elbow 2 Views Left  10/22/2014   CLINICAL DATA:  Recent onset of pain and swelling in the left elbow. No known injury. Initial encounter.  EXAM: LEFT ELBOW - 2 VIEW  COMPARISON:  None.  FINDINGS: The bones are demineralized. There is no evidence of acute fracture or dislocation. The joint spaces are relatively preserved. There is soft tissue swelling, especially dorsally and medially. There is a dorsal splint or a bandage which contains some lucencies. No definite soft tissue emphysema or foreign body.  IMPRESSION: Nonspecific soft tissue swelling.  No acute osseous findings.   Electronically Signed   By: Carey Bullocks M.D.   On: 10/22/2014 16:26    Scheduled Meds:    Continuous Infusions: . sodium chloride 100 mL/hr at 10/23/14 8119    Assessment/Plan: 1 Generalized weakness with difficulty in ambulation secondary to deconditioning and Hyponatremia  Rec Physical Therapy 2 Left elbow pain and swelling; Check se uric acid    X ray: No fracture 3 Hyponatremia: Improving with Hydration. Discussed with sons- May need rehab  Code Status: Full        Charnay Nazario   10/23/2014, 11:17 AM

## 2014-10-23 NOTE — Clinical Social Work Placement (Signed)
   CLINICAL SOCIAL WORK PLACEMENT  NOTE  Date:  10/23/2014  Patient Details  Name: Laura Sawyer MRN: [id number] Date of Birth: Nov 10, 1918  Clinical Social Work is seeking post-discharge placement for this patient at the Skilled  Nursing Facility level of care (*CSW will initial, date and re-position this form in  chart as items are completed):  Yes   Patient/family provided with [place] Clinical Social Work Department's list of facilities offering this level of care within the geographic area requested by the patient (or if unable, by the patient's family).  Yes   Patient/family informed of their freedom to choose among providers that offer the needed level of care, that participate in Medicare, Medicaid or managed care program needed by the patient, have an available bed and are willing to accept the patient.  Yes   Patient/family informed of [facility]'s ownership interest in [facility] and [facility], as well as of the fact that they are under no obligation to receive care at these facilities.  PASRR submitted to EDS on 10/23/14     PASRR number received on 10/23/14     Existing PASRR number confirmed on       FL2 transmitted to all facilities in geographic area requested by pt/family on 10/23/14     FL2 transmitted to all facilities within larger geographic area on       Patient informed that his/her managed care company has contracts with or will negotiate with certain facilities, including the following:            Patient/family informed of bed offers received.  Patient chooses bed at       Physician recommends and patient chooses bed at      Patient to be transferred to   on  .  Patient to be transferred to facility by       Patient family notified on   of transfer.  Name of family member notified:        PHYSICIAN Please sign FL2     Additional Comment:    _______________________________________________ Chauncy Passy, LCSW 10/23/2014,  12:40 PM

## 2014-10-23 NOTE — Plan of Care (Signed)
Problem: Discharge Progression Outcomes Goal: Other Discharge Outcomes/Goals Pt is alert and oriented x 4, denies pain, bed bound at this time, good appetite, bm throughout shift, frequent dressing changes on skin tear on elbow due to bleeding, daughter at bedside, update given over the phone to son, hgb stable. Awaiting on d/c to SNF. Uneventful shift.

## 2014-10-23 NOTE — Clinical Social Work Note (Signed)
Clinical Social Work Assessment  Patient Details  Name: Laura Sawyer MRN: [id number] Date of Birth: [birth year]  Date of referral:  10/23/14               Reason for consult:  Facility Placement                Permission sought to share information with:  Facility Medical sales representative, Family Supports Permission granted to share information::  Yes, Verbal Permission Granted  Name::     Daughter Dewayne Hatch and Son Rob   Agency::  SNF in [place]  Relationship::  Son and daughter   Solicitor Information:  [phone] and Daughter [phone]  Housing/Transportation Living arrangements for the past 2 months:  Single Family Home Source of Information:  Patient, Adult Children Patient Interpreter Needed:  None Criminal Activity/Legal Involvement Pertinent to Current Situation/Hospitalization:  No - Comment as needed Significant Relationships:  Adult Children Lives with:  Adult Children Do you feel safe going back to the place where you live?  No Need for family participation in patient care:  Yes (Comment)  Care giving concerns:  Pt's daughter Dewayne Hatch was not sure that she would be able to help her mother around the home.  Her daughter is disabled and in a wheelchair herself and cannot physically help pt to get up.     Social Worker assessment / plan:  CSW spoke to pt and pt's daughter in the room.  Ann confirmed that pt lived with her.  SHe stated that she can help cook and do laundry around the home but she would not be able to life the pt if she needed to be lifted.  Per Dewayne Hatch, pt and pt's daughter do not have any experience with SNF placement.  They are open to all facilities at this time.  CSW will f/u once offers have been received.    Employment status:  Retired Health and safety inspector:  [employer] PT Recommendations:  Skilled Nursing Facility Information / Referral to community resources:  Skilled Nursing Facility  Patient/Family's Response to care:  Both pt and pt's daughter were in  favor of SNF placement   Patient/Family's Understanding of and Emotional Response to Diagnosis, Current Treatment, and Prognosis:  Both pt and pt's daughter were in favor of SNF placement, and both expressed their understanding of the DC plan.   Emotional Assessment Appearance:  Appears stated age Attitude/Demeanor/Rapport:   (happy and playful) Affect (typically observed):   (Happy) Orientation:  Oriented to Self, Oriented to Place, Oriented to  Time, Oriented to Situation Alcohol / Substance use:  Never Used Psych involvement (Current and /or in the community):  No (Comment)  Discharge Needs  Concerns to be addressed:  Care Coordination Readmission within the last 30 days:  No Current discharge risk:  Physical Impairment Barriers to Discharge:  No Barriers Identified   Chauncy Passy, LCSW 10/23/2014, 12:27 PM

## 2014-10-24 LAB — CBC WITH DIFFERENTIAL/PLATELET
Basophils Absolute: 0 10*3/uL (ref 0–0.1)
Basophils Relative: 0 %
EOS ABS: 0.2 10*3/uL (ref 0–0.7)
Eosinophils Relative: 4 %
HCT: 35.6 % (ref 35.0–47.0)
Hemoglobin: 12.4 g/dL (ref 12.0–16.0)
LYMPHS PCT: 27 %
Lymphs Abs: 1.4 10*3/uL (ref 1.0–3.6)
MCH: 34.8 pg — ABNORMAL HIGH (ref 26.0–34.0)
MCHC: 34.9 g/dL (ref 32.0–36.0)
MCV: 99.9 fL (ref 80.0–100.0)
Monocytes Absolute: 0.4 10*3/uL (ref 0.2–0.9)
Monocytes Relative: 9 %
NEUTROS ABS: 3.1 10*3/uL (ref 1.4–6.5)
Neutrophils Relative %: 60 %
PLATELETS: 110 10*3/uL — AB (ref 150–440)
RBC: 3.56 MIL/uL — ABNORMAL LOW (ref 3.80–5.20)
RDW: 13.3 % (ref 11.5–14.5)
WBC: 5.1 10*3/uL (ref 3.6–11.0)

## 2014-10-24 LAB — BASIC METABOLIC PANEL
Anion gap: 3 — ABNORMAL LOW (ref 5–15)
BUN: 14 mg/dL (ref 6–20)
CHLORIDE: 104 mmol/L (ref 101–111)
CO2: 25 mmol/L (ref 22–32)
Calcium: 7.4 mg/dL — ABNORMAL LOW (ref 8.9–10.3)
Creatinine, Ser: <0.3 mg/dL — ABNORMAL LOW (ref 0.44–1.00)
Glucose, Bld: 84 mg/dL (ref 65–99)
Potassium: 3.9 mmol/L (ref 3.5–5.1)
Sodium: 132 mmol/L — ABNORMAL LOW (ref 135–145)

## 2014-10-24 LAB — URIC ACID: URIC ACID, SERUM: 2.6 mg/dL (ref 2.3–6.6)

## 2014-10-24 MED ORDER — ONDANSETRON HCL 4 MG PO TABS: 4.0000 mg | ORAL_TABLET | Freq: Four times a day (QID) | ORAL | Status: AC | PRN

## 2014-10-24 MED ORDER — ACETAMINOPHEN 325 MG PO TABS: 650.0000 mg | ORAL_TABLET | Freq: Four times a day (QID) | ORAL | Status: AC | PRN

## 2014-10-24 MED ORDER — CEFUROXIME AXETIL 250 MG PO TABS: 250.0000 mg | ORAL_TABLET | Freq: Two times a day (BID) | ORAL | Status: AC

## 2014-10-24 MED ORDER — CEFUROXIME AXETIL 250 MG PO TABS
250.0000 mg | ORAL_TABLET | Freq: Two times a day (BID) | ORAL | Status: DC
  Administered 2014-10-24: 08:00:00 250 mg via ORAL
  Filled 2014-10-24 (×3): qty 1

## 2014-10-24 NOTE — Progress Notes (Signed)
CSW was notified that Pt has been medically cleared for dc to SNF. CSW presented Pt and her 4 children SNF bed offers. Pt and her family selected SNF [facility]. CSW prepared dc packet and sent dc summary to SNF. RN to call report and EMS for transport. Pt's family also requesting advance directives information for Pt. CSW asked unit secretary to call chaplain for info.    Pt pleased with hospital stay. No further CSW needs at this time.   Wilford Grist, ,LCSW [phone]

## 2014-10-24 NOTE — Progress Notes (Signed)
Spoke with Durward Mallard, [facility] rep at [phone], to notify of non-emergent EMS transport.  Auth notification reference given as [id number].   Service date range good from 10/24/14 - 01/22/15.   Gap exception requested to determine if services can be considered at an in-network level.

## 2014-10-24 NOTE — Discharge Summary (Signed)
Laura Sawyer, is a 81 y.o. female  DOB 06-13-1918  MRN [id number].  Admission date:  10/21/2014  Admitting Physician  Henreitta Leber, MD  Discharge Date:  10/24/2014   Primary MD  Olamide Lahaie D, MD  Recommendations for primary care physician for things to follow:   Needs PT CBC, Met B in 1 week Wound care to LUE   Admission Diagnosis  Hyponatremia [E87.1] Weakness [R53.1]   Discharge Diagnosis  Hyponatremia [E87.1] Weakness [R53.1]  LUE skin tear  Active Problems:   Hyponatremia      Past Medical History  Diagnosis Date  . Arthritis   . Hepatitis     History reviewed. No pertinent past surgical history.     History of present illness and  Hospital Course:     Kindly see H&P for history of present illness and admission details, please review complete Labs, Consult reports and Test reports for all details in brief  HPI  from the history and physical done on the day of admission    Hospital Course    Pt admitted with weakness and hyponatremia. Sodium improved with IV NS. Weakness improved. Laceration/skin tear to LUE noted. X-rays negative. Seen by PT. SNF rehab recommended. Pt and family agreeable.   Discharge Condition: stable   Follow UP  Follow-up Information    Follow up with Shalla Bulluck D, MD In 1 week.   Specialty:  Internal Medicine   Contact information:   [place] [address] [phone]         Discharge Instructions  and  Discharge Medications   Needs PT CBC, Met B 1 week     Medication List    TAKE these medications        acetaminophen 325 MG tablet  Commonly known as:  TYLENOL  Take 2 tablets (650 mg total) by mouth every 6 (six) hours as needed for mild pain (or Fever >/= 101).     cefUROXime 250 MG tablet  Commonly known as:  CEFTIN  Take 1 tablet (250 mg total) by mouth 2 (two) times daily with a meal.     ibuprofen 200 MG tablet  Commonly known as:  ADVIL,MOTRIN  Take 200 mg by mouth every 6 (six) hours as needed.     ondansetron 4 MG tablet  Commonly known as:  ZOFRAN  Take 1 tablet (4 mg total) by mouth every 6 (six) hours as needed for nausea.     traMADol 50 MG tablet  Commonly known as:  ULTRAM  Take 25 mg by mouth 2 (two) times daily as needed.          Diet and Activity recommendation: See Discharge Instructions above   Consults obtained - PT, CSW   Major procedures and Radiology Reports - PLEASE review detailed and final reports for all details, in brief -   See below   Dg Elbow 2 Views Left  10/22/2014   CLINICAL DATA:  Recent onset of pain and swelling in the left elbow. No  known injury. Initial encounter.  EXAM: LEFT ELBOW - 2 VIEW  COMPARISON:  None.  FINDINGS: The bones are demineralized. There is no evidence of acute fracture or dislocation. The joint spaces are relatively preserved. There is soft tissue swelling, especially dorsally and medially. There is a dorsal splint or a bandage which contains some lucencies. No definite soft tissue emphysema or foreign body.  IMPRESSION: Nonspecific soft tissue swelling.  No acute osseous findings.   Electronically Signed   By: Richardean Sale M.D.   On: 10/22/2014 16:26    Micro Results   See below  No results found for this or any previous visit (from the past 240 hour(s)).     Today   Subjective:   Laura Sawyer today has no headache,no chest abdominal pain,no new weakness tingling or numbness, feels much better wants to go  To SNF today  Objective:   Blood pressure 132/84, pulse 69, temperature 97.7 F (36.5 C), temperature source Oral, resp. rate 18, height _0  (1.372 m), weight 49.244 kg (108 lb 9 oz), SpO2 96 %.   Intake/Output Summary (Last 24 hours) at 10/24/14 0723 Last data filed at 10/24/14 0441  Gross per 24 hour  Intake   4870 ml  Output   1900 ml  Net   2970 ml    Exam Awake Alert,  Oriented x 3, No new F.N deficits, Normal affect Jay.AT,PERRAL Supple Neck,No JVD, No cervical lymphadenopathy appriciated.  Symmetrical Chest wall movement, Good air movement bilaterally, CTAB RRR,No Gallops,Rubs or new Murmurs, No Parasternal Heave +ve B.Sounds, Abd Soft, Non tender, No organomegaly appriciated, No rebound -guarding or rigidity. No Cyanosis, Clubbing or edema, No new Rash or bruise  Data Review   CBC w Diff: Lab Results  Component Value Date   WBC 5.1 10/24/2014   HGB 12.4 10/24/2014   HCT 35.6 10/24/2014   PLT 110* 10/24/2014   LYMPHOPCT 27 10/24/2014   MONOPCT 9 10/24/2014   EOSPCT 4 10/24/2014   BASOPCT 0 10/24/2014    CMP: Lab Results  Component Value Date   NA 132* 10/24/2014   K 3.9 10/24/2014   CL 104 10/24/2014   CO2 25 10/24/2014   BUN 14 10/24/2014   CREATININE <0.30* 10/24/2014   PROT 6.6 10/21/2014   ALBUMIN 3.8 10/21/2014   BILITOT 0.9 10/21/2014   ALKPHOS 71 10/21/2014   AST 35 10/21/2014   ALT 16 10/21/2014  .   Total Time in preparing paper work, data evaluation and todays exam - 41 minutes  Janasia Coverdale D M.D on 10/24/2014 at 7:23 AM

## 2014-10-24 NOTE — Discharge Instructions (Signed)
Needs PT daily CBC, Met B in 1 week

## 2014-10-24 NOTE — Progress Notes (Signed)
   10/24/14 1130  Clinical Encounter Type  Visited With Patient and family together  Visit Type Initial  Referral From Nurse  Consult/Referral To Chaplain  Spiritual Encounters  Spiritual Needs Literature;Brochure  Stress Factors  Patient Stress Factors Health changes  Family Stress Factors None identified  Chaplain was paged to the unit to provide an advanced directive packet to patient and family. Offered to provide inforomation and support as applicable. Chaplain Angeleah Labrake A. Jelicia Nantz Ext. [phone]

## 2014-10-24 NOTE — Progress Notes (Signed)
Physical Therapy Treatment Patient Details Name: Naomia Lenderman MRN: [id number] DOB: [birth year] Today's Date: 11/08/14    History of Present Illness Pt with recent weakness inability to transfer, also having sever L elbow pain    PT Comments    Pt is making gradual progress towards goals with increased tolerance of there-ex this date. Pt motivated to participate in therapy, however limited by pain/decreased endurance. Pt with increased R knee pain during knee flexion, therefore resisted there-ex deferred.   Follow Up Recommendations  SNF     Equipment Recommendations       Recommendations for Other Services       Precautions / Restrictions Precautions Precautions: Fall Restrictions Weight Bearing Restrictions: No    Mobility  Bed Mobility               General bed mobility comments: not performed this date as pt getting ready to transfer to rehab  Transfers                 General transfer comment: deferred this session  Ambulation/Gait                 Stairs            Wheelchair Mobility    Modified Rankin (Stroke Patients Only)       Balance                                    Cognition Arousal/Alertness: Awake/alert Behavior During Therapy: WFL for tasks assessed/performed Overall Cognitive Status: Within Functional Limits for tasks assessed                      Exercises Other Exercises Other Exercises: Pt performed supine ther-ex including B knee flexion with resisted knee extension on L side, SLRs, SAQ, and B shoulder/elbow flexion. All ther-ex performed x 10 reps with cues for sequencing and min/mod assist for correct technique. Pt requires breaks secondary to fatigue with cues for breathing.    General Comments        Pertinent Vitals/Pain Pain Assessment: No/denies pain    Home Living                      Prior Function            PT Goals (current goals can now be found in the  care plan section) Acute Rehab PT Goals Patient Stated Goal: to go to rehab PT Goal Formulation: With patient/family Time For Goal Achievement: 11/05/14 Potential to Achieve Goals: Good Progress towards PT goals: Progressing toward goals    Frequency  Min 2X/week    PT Plan Current plan remains appropriate    Co-evaluation             End of Session   Activity Tolerance: Patient limited by fatigue Patient left: in bed;with bed alarm set;with family/visitor present     Time: 1205-1228 PT Time Calculation (min) (ACUTE ONLY): 23 min  Charges:  $Therapeutic Exercise: 23-37 mins                    G Codes:      Evangelia Whitaker 08-Nov-2014, 1:53 PM  Elizabeth Palau, PT, DPT [phone]

## 2014-10-24 NOTE — Plan of Care (Signed)
Problem: Discharge Progression Outcomes Goal: Other Discharge Outcomes/Goals Outcome: Adequate for Discharge Patient had no c/o pain this shift VSS,  Dressing changed to skin tear on left elbow  Received orders to d/c patient to [facility], report called to Victorino Dike RN at [facility]  Patient discharged in stretcher with EMS

## 2014-10-24 NOTE — Plan of Care (Signed)
Problem: Discharge Progression Outcomes Goal: Other Discharge Outcomes/Goals Outcome: Progressing Patient is alert and oriented, no c/o pain. Bedbound, utilizes bedpan, calls for assistance when needed. Resting quietly.

## 2014-10-24 NOTE — Clinical Social Work Placement (Signed)
   CLINICAL SOCIAL WORK PLACEMENT  NOTE  Date:  10/24/2014  Patient Details  Name: Atalia Litzinger MRN: [id number] Date of Birth: 05/20/1918  Clinical Social Work is seeking post-discharge placement for this patient at the Skilled  Nursing Facility level of care (*CSW will initial, date and re-position this form in  chart as items are completed):  Yes   Patient/family provided with [facility] Clinical Social Work Department's list of facilities offering this level of care within the geographic area requested by the patient (or if unable, by the patient's family).  Yes   Patient/family informed of their freedom to choose among providers that offer the needed level of care, that participate in Medicare, Medicaid or managed care program needed by the patient, have an available bed and are willing to accept the patient.  Yes   Patient/family informed of [facility]'s ownership interest in [facility] and [facility], as well as of the fact that they are under no obligation to receive care at these facilities.  PASRR submitted to EDS on 10/23/14     PASRR number received on 10/23/14     Existing PASRR number confirmed on       FL2 transmitted to all facilities in geographic area requested by pt/family on 10/23/14     FL2 transmitted to all facilities within larger geographic area on       Patient informed that his/her managed care company has contracts with or will negotiate with certain facilities, including the following:            Patient/family informed of bed offers received.  Patient chooses bed at  [facility])     Physician recommends and patient chooses bed at      Patient to be transferred to  [facility]) on 10/24/14.  Patient to be transferred to facility by EMS     Patient family notified on 10/24/14 of transfer.  Name of family member notified:  All children at bedside     PHYSICIAN Please sign FL2     Additional Comment:     _______________________________________________ Ned Card, LCSW 10/24/2014, 4:25 PM

## 2016-03-22 IMAGING — US US EXTREM LOW VENOUS*R*
1 series · 13 of 24 positions shown · non-contrast
Comparison: None.

ADDENDUM:
Exam: Right lower extremity ultrasound.
CLINICAL DATA: Right leg trauma.  Pain and swelling

EXAM:
LOWER EXTREMITY VENOUS DOPPLER ULTRASOUND
TECHNIQUE: Gray-scale sonography with graded compression, as well as color
Doppler and duplex ultrasound were performed to evaluate the lower
extremity deep venous systems from the level of the common femoral
vein and including the common femoral, femoral, profunda femoral,
popliteal and calf veins including the posterior tibial, peroneal
and gastrocnemius veins when visible. The superficial great
saphenous vein was also interrogated. Spectral Doppler was utilized
to evaluate flow at rest and with distal augmentation maneuvers in
the common femoral, femoral and popliteal veins.

[Series 1: us extrem low venous*right* · 0.06mm/px · 13 of 30 slices shown]
[im 1/30]
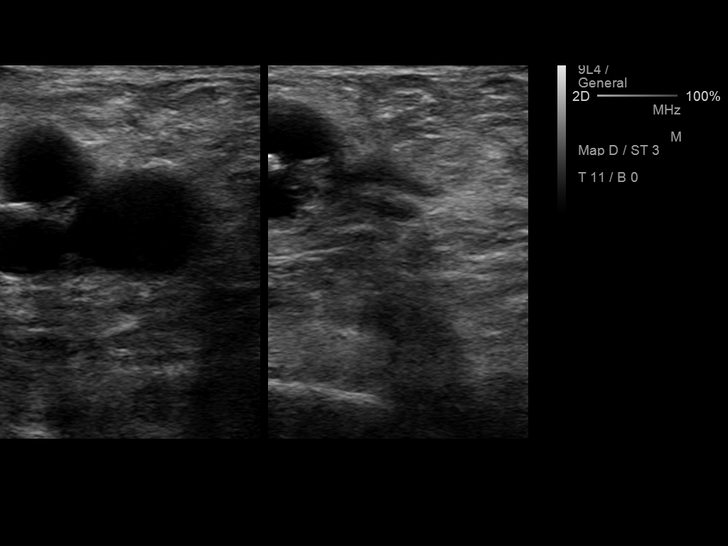
[im 3/30]
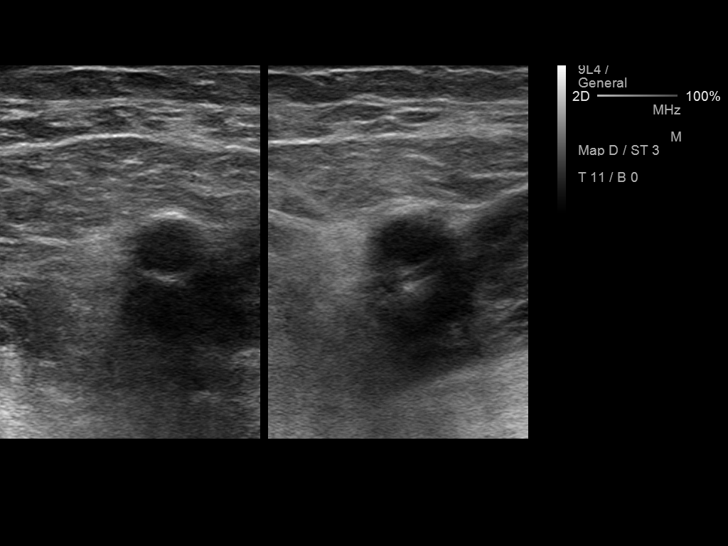
[im 6/30]
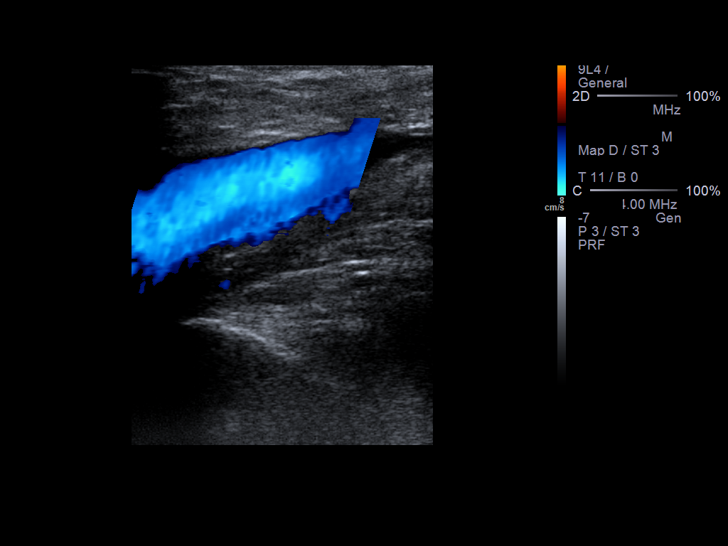
[im 8/30]
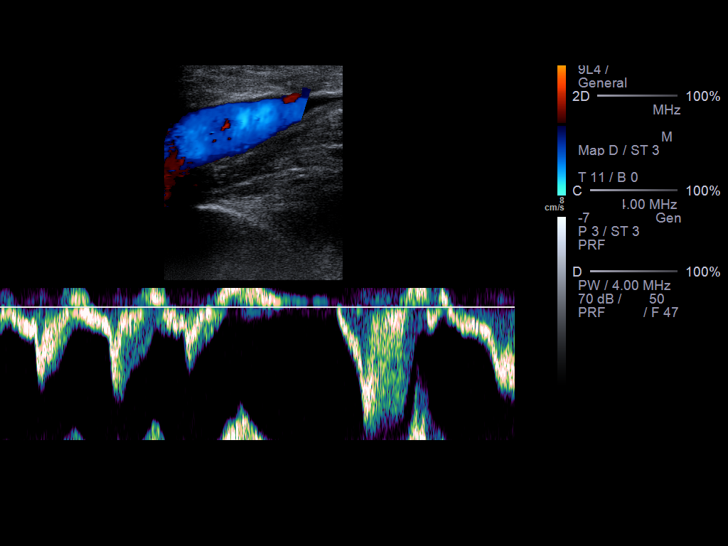
[im 11/30]
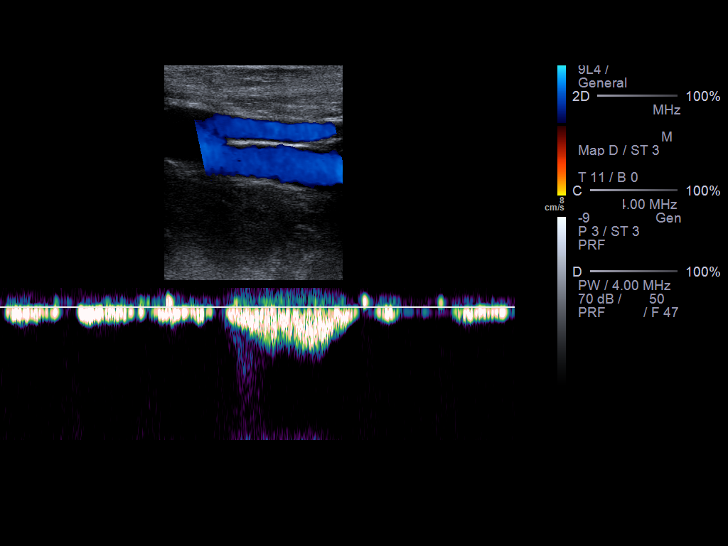
[im 13/30]
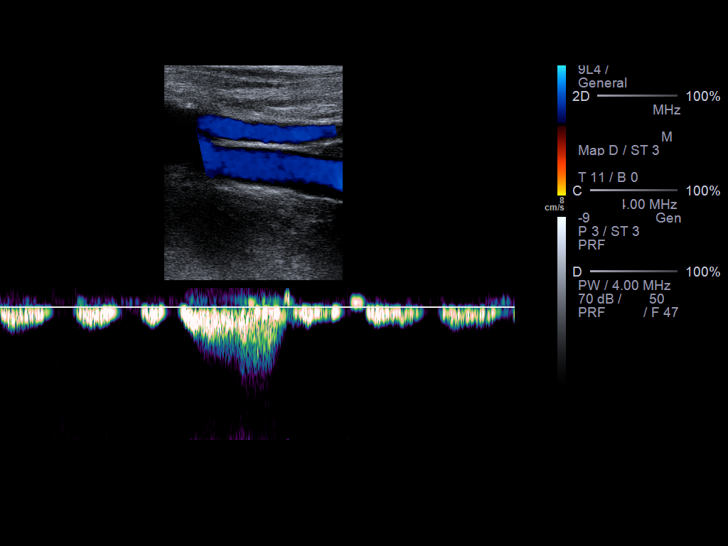
[im 16/30]
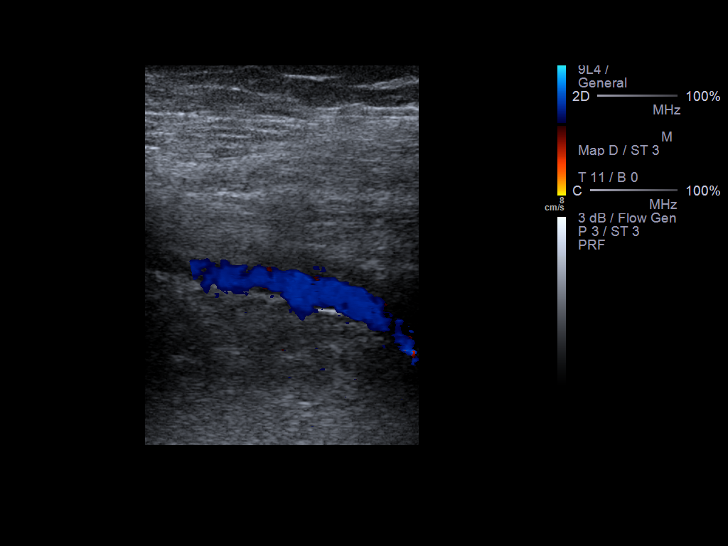
[im 17/30]
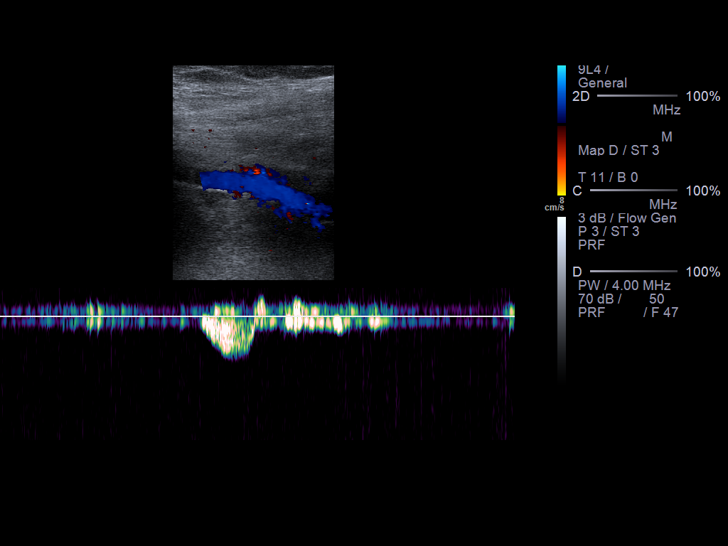
[im 19/30]
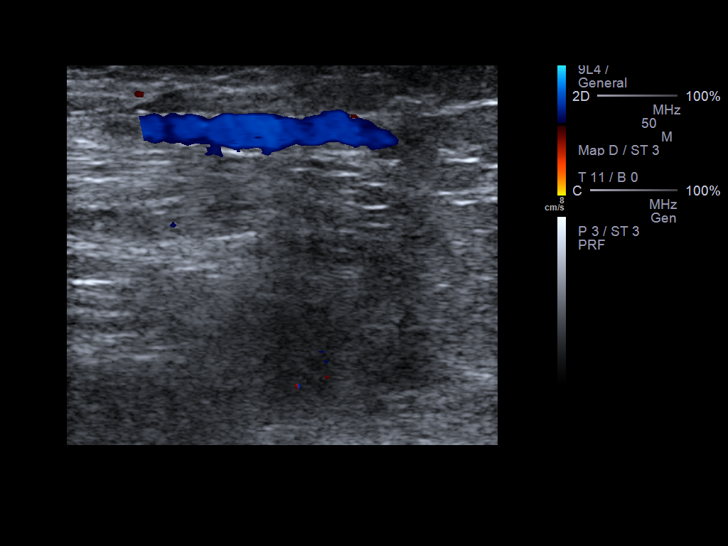
[im 22/30]
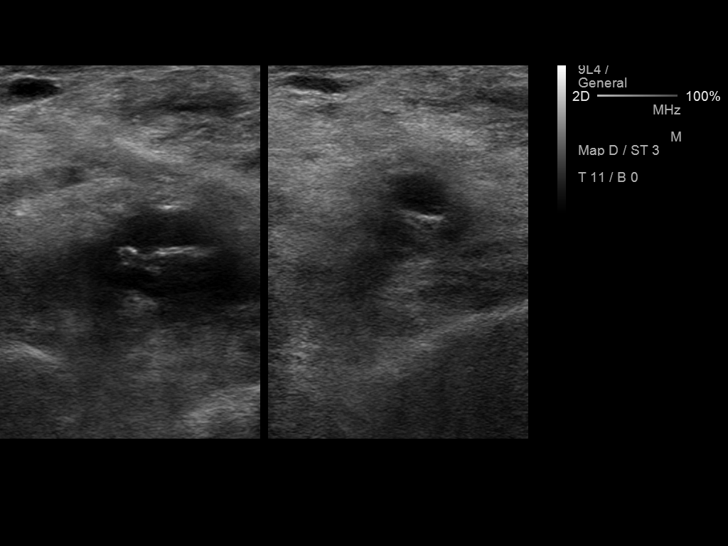
[im 24/30]
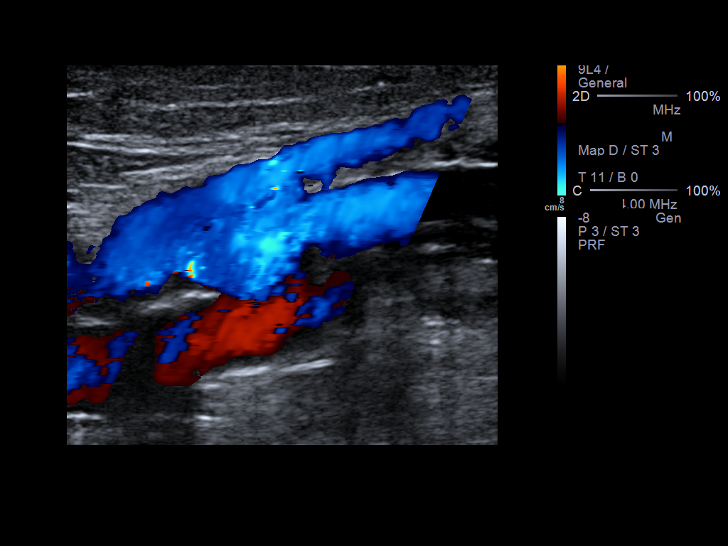
[im 27/30]
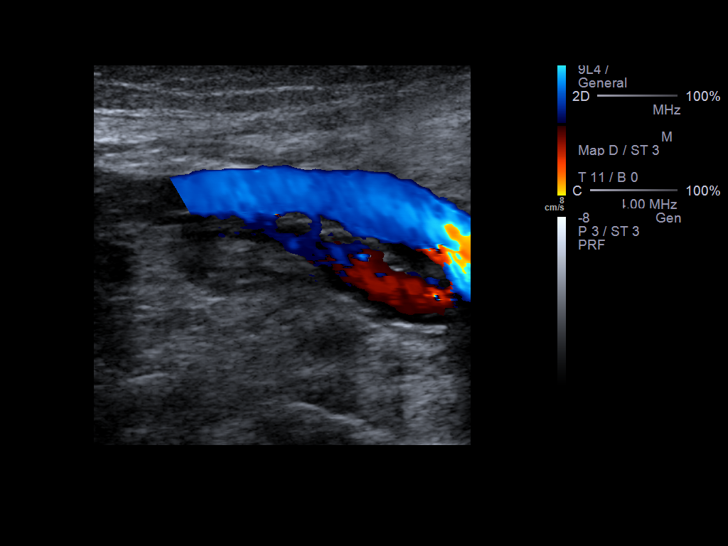
[im 30/30]
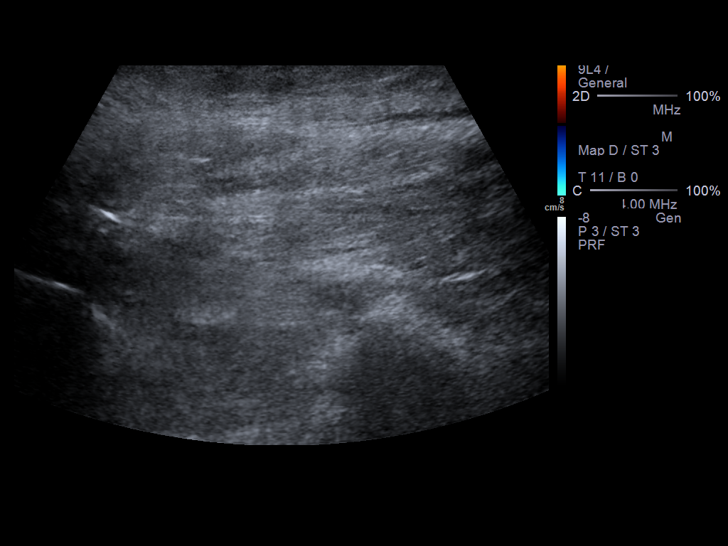

[13 of 24 positions shown; findings below may reference images not displayed]

IMPRESSION: No deep venous thrombosis within the right lower
extremity.
FINDINGS: Common Femoral Vein: No evidence of thrombus. Normal
compressibility, respiratory phasicity and response to augmentation.

Saphenofemoral Junction: No evidence of thrombus. Normal
compressibility and flow on color Doppler imaging.

Profunda Femoral Vein: No evidence of thrombus. Normal
compressibility and flow on color Doppler imaging.

Femoral Vein: No evidence of thrombus. Normal compressibility,
respiratory phasicity and response to augmentation.

Popliteal Vein: No evidence of thrombus. Normal compressibility,
respiratory phasicity and response to augmentation.

Calf Veins: Not evaluated

Superficial Great Saphenous Vein: No evidence of thrombus. Normal
compressibility and flow on color Doppler imaging.

Mild subcutaneous edema.
IMPRESSION: No evidence of deep venous thrombosis.

## 2017-04-20 IMAGING — CR DG ELBOW 2V*L*
1 series · 2 of 2 positions shown · non-contrast
Comparison: None.

CLINICAL DATA: Recent onset of pain and swelling in the left elbow.
No known injury. Initial encounter.

EXAM:
LEFT ELBOW - 2 VIEW

[Series 1: lat · 0.17mm/px · 2 of 2 slices shown]
[im 1/2]
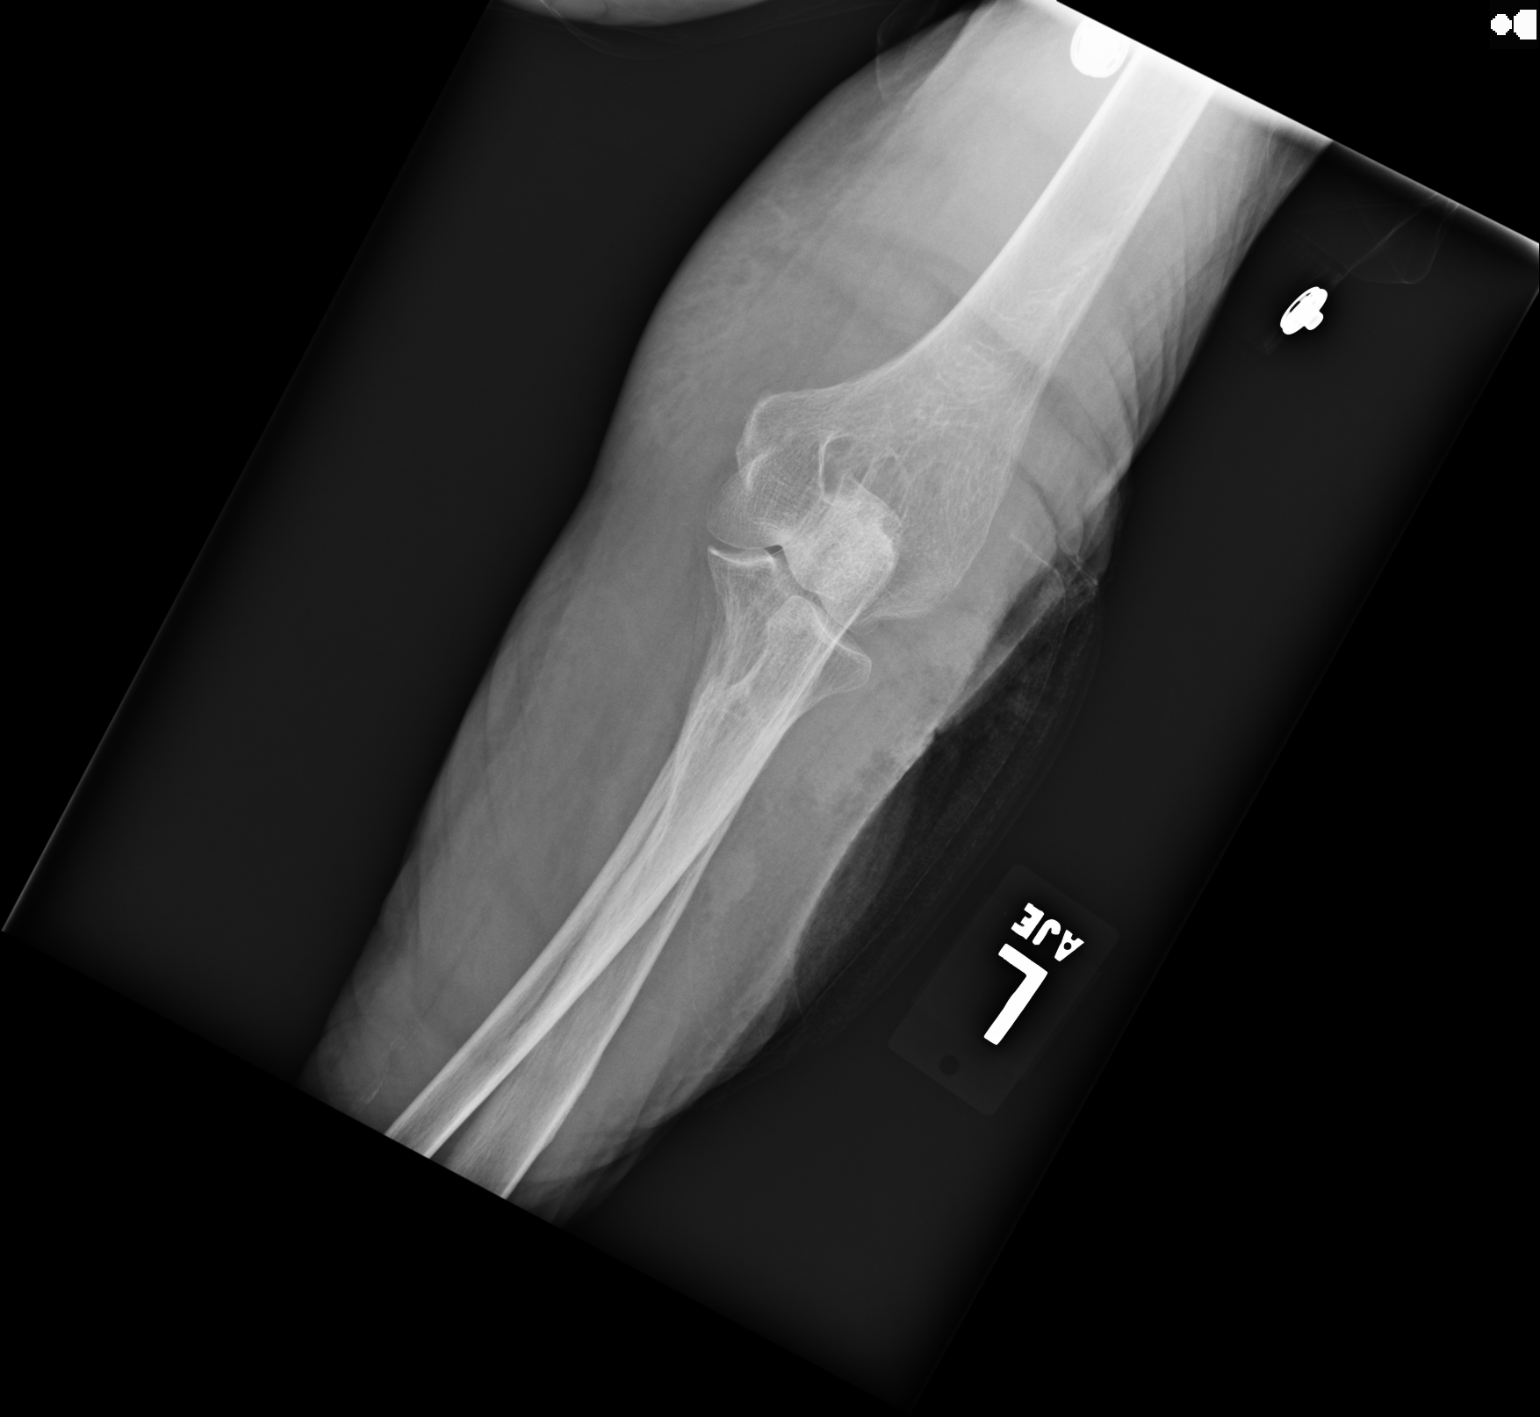
[im 2/2]
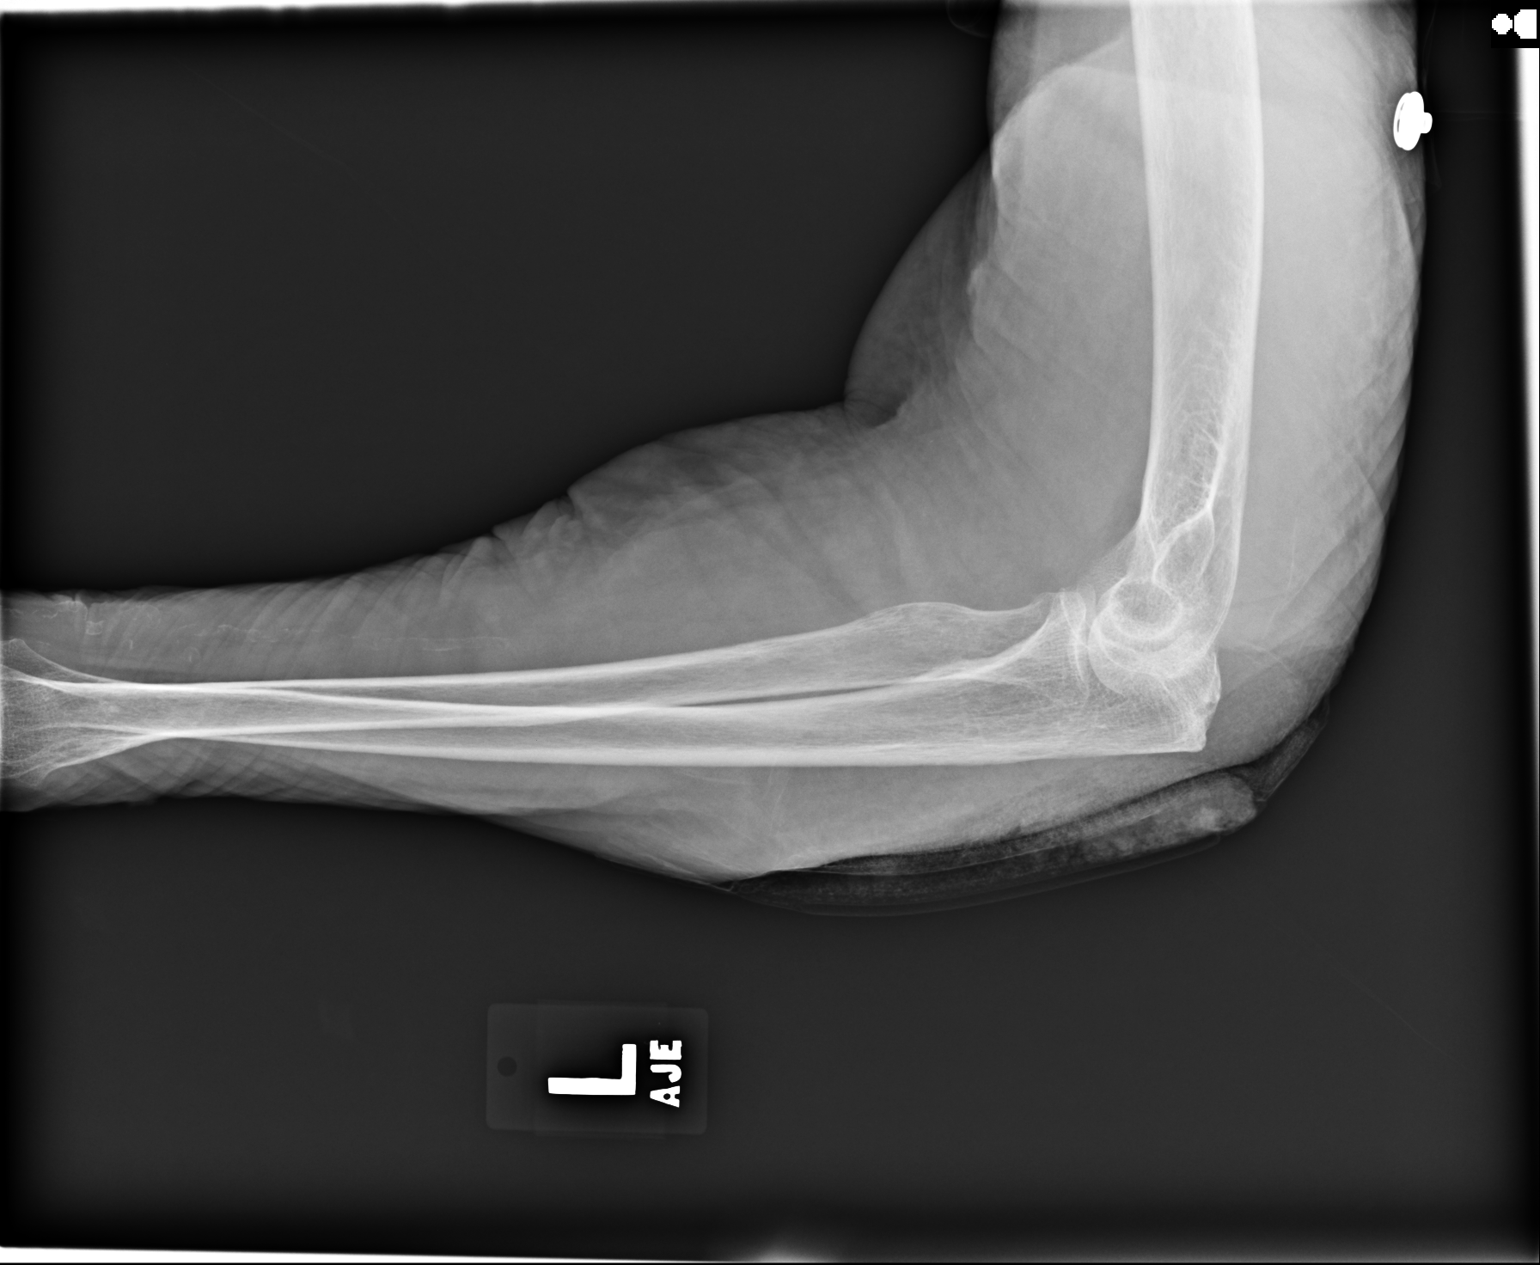

[2 of 2 positions shown; findings below may reference images not displayed]

FINDINGS: The bones are demineralized. There is no evidence of acute fracture
or dislocation. The joint spaces are relatively preserved. There is
soft tissue swelling, especially dorsally and medially. There is a
dorsal splint or a bandage which contains some lucencies. No
definite soft tissue emphysema or foreign body.
IMPRESSION: Nonspecific soft tissue swelling.  No acute osseous findings.

## 2020-01-17 DEATH — deceased
